# Patient Record
Sex: Male | Born: 1980 | Hispanic: Yes | Marital: Single | Smoking: Never smoker
Health system: Southern US, Community
[De-identification: ages and names within clinical notes are randomized; demographics above are authoritative.]

---

## 2021-04-17 ENCOUNTER — Ambulatory Visit
Admission: EM | Admit: 2021-04-17 | Discharge: 2021-04-17 | Disposition: A | Discharge status: Home / Self Care | Payer: 59 | Attending: Physician Assistant | Admitting: Physician Assistant

## 2021-04-17 ENCOUNTER — Other Ambulatory Visit: Payer: Self-pay

## 2021-04-17 DIAGNOSIS — Z20822 Contact with and (suspected) exposure to covid-19: Secondary | ICD-10-CM

## 2021-04-17 NOTE — ED Triage Notes (Signed)
Pt c/o chest pressure, "bubble guts," "sombody has their hand poking me in the back," headache, body aches and chills, nausea, diarrhea.   Denies cough, vomiting, constipation.   Onset Tuesday. Covid exposure in house with wife and son.

## 2021-04-17 NOTE — ED Provider Notes (Signed)
[facility]    CSN: [id number] Arrival date & time: 04/17/21  1217      History   Chief Complaint Chief Complaint  Patient presents with   Cough    HPI Roy Thompson is a 40 y.o. male.   Patient here today for evaluation of nasal congestion, chest congestion, cough, body aches, chills that started about 2 days ago.  He reports that he has had exposure to COVID through his wife and son.  He has tried over-the-counter medication without resolution.  He reports he did have diarrhea this morning as well.  The history is provided by the patient.  Cough Associated symptoms: chills   Associated symptoms: no ear pain, no eye discharge, no fever, no shortness of breath and no sore throat    History reviewed. No pertinent past medical history.  There are no problems to display for this patient.   History reviewed. No pertinent surgical history.     Home Medications    Prior to Admission medications   Not on File    Family History History reviewed. No pertinent family history.  Social History Social History   Tobacco Use   Smoking status: Never   Smokeless tobacco: Never     Allergies   Patient has no allergy information on record.   Review of Systems Review of Systems  Constitutional:  Positive for chills. Negative for fever.  HENT:  Positive for congestion. Negative for ear pain and sore throat.   Eyes:  Negative for discharge and redness.  Respiratory:  Positive for cough. Negative for shortness of breath.   Gastrointestinal:  Positive for diarrhea. Negative for abdominal pain, nausea and vomiting.    Physical Exam Triage Vital Signs ED Triage Vitals  Enc Vitals Group     BP 04/17/21 1420 (!) 165/113     Pulse Rate 04/17/21 1420 72     Resp 04/17/21 1420 18     Temp 04/17/21 1420 98.1 F (36.7 C)     Temp Source 04/17/21 1420 Oral     SpO2 04/17/21 1420 97 %     Weight --      Height --      Head Circumference --      Peak  Flow --      Pain Score 04/17/21 1422 0     Pain Loc --      Pain Edu? --      Excl. in GC? --    No data found.  Updated Vital Signs BP (!) 165/113 (BP Location: Left Arm)    Pulse 72    Temp 98.1 F (36.7 C) (Oral)    Resp 18    SpO2 97%      Physical Exam Vitals and nursing note reviewed.  Constitutional:      General: He is not in acute distress.    Appearance: Normal appearance. He is not ill-appearing.  HENT:     Head: Normocephalic and atraumatic.     Nose: Congestion present.  Eyes:     Conjunctiva/sclera: Conjunctivae normal.  Cardiovascular:     Rate and Rhythm: Normal rate and regular rhythm.     Heart sounds: Normal heart sounds. No murmur heard. Pulmonary:     Effort: Pulmonary effort is normal. No respiratory distress.     Breath sounds: Normal breath sounds. No wheezing, rhonchi or rales.  Skin:    General: Skin is warm and dry.  Neurological:     Mental Status: He is  alert.  Psychiatric:        Mood and Affect: Mood normal.        Thought Content: Thought content normal.     UC Treatments / Results  Labs (all labs ordered are listed, but only abnormal results are displayed) Labs Reviewed  COVID-19, FLU A+B NAA    EKG   Radiology No results found.  Procedures Procedures (including critical care time)  Medications Ordered in UC Medications - No data to display  Initial Impression / Assessment and Plan / UC Course  I have reviewed the triage vital signs and the nursing notes.  Pertinent labs & imaging results that were available during my care of the patient were reviewed by me and considered in my medical decision making (see chart for details).    Suspect symptoms are most likely related to COVID but will screen for same. Recommend symptomatic treatment and follow up with any further concerns.   Final Clinical Impressions(s) / UC Diagnoses   Final diagnoses:  Close exposure to COVID-19 virus   Discharge Instructions   None     ED Prescriptions   None    PDMP not reviewed this encounter.   Tomi Bamberger, PA-C 04/17/21 1453

## 2021-04-18 LAB — COVID-19, FLU A+B NAA
Influenza A, NAA: NOT DETECTED
Influenza B, NAA: NOT DETECTED
SARS-CoV-2, NAA: DETECTED — AB

## 2021-10-01 ENCOUNTER — Ambulatory Visit
Admission: EM | Admit: 2021-10-01 | Discharge: 2021-10-01 | Disposition: A | Discharge status: Home / Self Care | Payer: 59 | Attending: Urgent Care | Admitting: Urgent Care

## 2021-10-01 ENCOUNTER — Ambulatory Visit (INDEPENDENT_AMBULATORY_CARE_PROVIDER_SITE_OTHER): Payer: 59

## 2021-10-01 DIAGNOSIS — S91311A Laceration without foreign body, right foot, initial encounter: Secondary | ICD-10-CM | POA: Diagnosis not present

## 2021-10-01 DIAGNOSIS — Z23 Encounter for immunization: Secondary | ICD-10-CM | POA: Diagnosis not present

## 2021-10-01 DIAGNOSIS — S91011A Laceration without foreign body, right ankle, initial encounter: Secondary | ICD-10-CM | POA: Diagnosis not present

## 2021-10-01 MED ORDER — TETANUS-DIPHTH-ACELL PERTUSSIS 5-2.5-18.5 LF-MCG/0.5 IM SUSY
0.5000 mL | PREFILLED_SYRINGE | Freq: Once | INTRAMUSCULAR | Status: AC
  Administered 2021-10-01: 0.5 mL via INTRAMUSCULAR

## 2021-10-01 NOTE — Discharge Instructions (Addendum)
You have no retained glass in your cut. Keep your stitches dry for the first 24-48 hours. Apply topical polysporin or bacitracin ointment only. Cover with a NONSTICK pad. Do not use a woven gauze. After the first 24 hours, keep the wound open at night or while at home, keep covered when at work or out of the house. Return for recheck in 7 days. Will removed the sutures in 7-10 days depending on its appearance. Monitor for signs of infection including redness, warmth, or drainage. You received a tetanus vaccination today.

## 2021-10-01 NOTE — ED Triage Notes (Signed)
Pt c/o laceration to right posterior ankle 2/2 dropping glass on foot. Laceration actively bleeding. Pressure applied.

## 2021-10-05 NOTE — ED Provider Notes (Signed)
[facility]    CSN: [id number] Arrival date & time: 10/01/21  1620      History   Chief Complaint Chief Complaint  Patient presents with   Laceration    HPI Roy Thompson is a 41 y.o. male.   Pleasant 40yo male presents after sustaining a laceration to his R posterior heel. He works as a Therapist, occupationalwindow installer, and states a glass pane fell cutting his posterior heel roughly 3 hours prior to arrival. He did not clean his wound prior. He is uncertain his last Tdap. He reports full sensation of his ankle/ toes and FROM.   Laceration  History reviewed. No pertinent past medical history.  There are no problems to display for this patient.   History reviewed. No pertinent surgical history.     Home Medications    Prior to Admission medications   Not on File    Family History History reviewed. No pertinent family history.  Social History Social History   Tobacco Use   Smoking status: Never   Smokeless tobacco: Never     Allergies   Patient has no allergy information on record.   Review of Systems Review of Systems  Skin:  Positive for wound (laceration right heel).    Physical Exam Triage Vital Signs ED Triage Vitals [10/01/21 1656]  Enc Vitals Group     BP (!) 149/83     Pulse Rate 72     Resp 18     Temp 98.3 F (36.8 C)     Temp Source Oral     SpO2 96 %     Weight      Height      Head Circumference      Peak Flow      Pain Score 0     Pain Loc      Pain Edu?      Excl. in GC?    No data found.  Updated Vital Signs BP (!) 149/83 (BP Location: Left Arm)   Pulse 72   Temp 98.3 F (36.8 C) (Oral)   Resp 18   SpO2 96%   Visual Acuity Right Eye Distance:   Left Eye Distance:   Bilateral Distance:    Right Eye Near:   Left Eye Near:    Bilateral Near:     Physical Exam Vitals and nursing note reviewed. Exam conducted with a chaperone present.  Constitutional:      Appearance: Normal appearance. He is normal  weight. He is not ill-appearing, toxic-appearing or diaphoretic.  HENT:     Head: Normocephalic and atraumatic.  Cardiovascular:     Rate and Rhythm: Normal rate.     Pulses: Normal pulses.  Pulmonary:     Effort: Pulmonary effort is normal. No respiratory distress.  Musculoskeletal:        General: Signs of injury (1.5" superficial laceration to R posterior heel overtop of achiles tendon, but not revealing any subcutaneous tissue. No tendon involvement. Neurovascularly intact. FROM. No FB after meticulous inspection.) present. No swelling, tenderness or deformity. Normal range of motion.     Right lower leg: No edema.     Left lower leg: No edema.  Skin:    General: Skin is warm and dry.     Capillary Refill: Capillary refill takes less than 2 seconds.     Coloration: Skin is not jaundiced.     Findings: No bruising.  Neurological:     General: No focal deficit present.  Mental Status: He is alert and oriented to person, place, and time.     Sensory: No sensory deficit.     Motor: No weakness.  Psychiatric:        Mood and Affect: Mood normal.     UC Treatments / Results  Labs (all labs ordered are listed, but only abnormal results are displayed) Labs Reviewed - No data to display  EKG   Radiology CLINICAL DATA:  Laceration posterior ankle   EXAM: RIGHT ANKLE - COMPLETE 3+ VIEW   COMPARISON:  None Available.   FINDINGS: Normal alignment no fracture.  Ankle joint space normal.   Posterior laceration.  No foreign body.   IMPRESSION: Negative for fracture.  Posterior laceration.     Electronically Signed   By: Marlan Palau M.D.   On: 10/01/2021 17:37  Procedures Laceration Repair  Date/Time: 10/01/2021 5:37 PM Performed by: Maretta Bees, PA Authorized by: Maretta Bees, PA   Consent:    Consent obtained:  Verbal   Consent given by:  Patient   Risks, benefits, and alternatives were discussed: yes     Risks discussed:  Infection, pain,  retained foreign body, tendon damage, poor wound healing, poor cosmetic result, vascular damage, nerve damage and need for additional repair   Alternatives discussed:  No treatment Universal protocol:    Procedure explained and questions answered to patient or proxy's satisfaction: yes     Imaging studies available: yes     Patient identity confirmed:  Verbally with patient Anesthesia:    Anesthesia method:  Topical application and local infiltration   Topical anesthetic:  LET   Local anesthetic:  Lidocaine 1% w/o epi Laceration details:    Location:  Foot   Foot location:  R achilles   Length (cm):  3   Depth (mm):  2 Pre-procedure details:    Preparation:  Patient was prepped and draped in usual sterile fashion and imaging obtained to evaluate for foreign bodies Exploration:    Limited defect created (wound extended): yes     Hemostasis achieved with:  Direct pressure and LET   Imaging obtained: x-ray     Imaging outcome: foreign body not noted     Wound exploration: wound explored through full range of motion and entire depth of wound visualized     Contaminated: no   Treatment:    Area cleansed with:  Povidone-iodine   Amount of cleaning:  Standard   Irrigation solution:  Sterile saline   Irrigation method:  Syringe Skin repair:    Repair method:  Sutures   Suture size:  3-0   Suture material:  Prolene   Suture technique:  Simple interrupted   Number of sutures:  5 Approximation:    Approximation:  Close Repair type:    Repair type:  Simple Post-procedure details:    Dressing:  Antibiotic ointment and non-adherent dressing   Procedure completion:  Tolerated (including critical care time)  Medications Ordered in UC Medications  Tdap (BOOSTRIX) injection 0.5 mL (0.5 mLs Intramuscular Given 10/01/21 1702)    Initial Impression / Assessment and Plan / UC Course  I have reviewed the triage vital signs and the nursing notes.  Pertinent labs & imaging results that were  available during my care of the patient were reviewed by me and considered in my medical decision making (see chart for details).     Simple laceration to Posterior R ankle - xray performed given patients discomfort level. No radiopaque FB, no FB  visually after meticulous inspection. This was a relatively superficial lac with no subcutaneous tissue. Five 3-0 sutures placed in sterile fashion after ample wound cleansing and application of LET gel. Pt tolerated procedure well. Tdap updated. Wound care instructions given, will RTC in 7-10 days for suture removal, sooner if any signs of infection. Apply topical abx ointment.  Tdap - updated in office.  Final Clinical Impressions(s) / UC Diagnoses   Final diagnoses:  Laceration of right heel, initial encounter  Need for prophylactic vaccination with combined diphtheria-tetanus-pertussis (DTP) vaccine     Discharge Instructions      You have no retained glass in your cut. Keep your stitches dry for the first 24-48 hours. Apply topical polysporin or bacitracin ointment only. Cover with a NONSTICK pad. Do not use a woven gauze. After the first 24 hours, keep the wound open at night or while at home, keep covered when at work or out of the house. Return for recheck in 7 days. Will removed the sutures in 7-10 days depending on its appearance. Monitor for signs of infection including redness, warmth, or drainage. You received a tetanus vaccination today.   ED Prescriptions   None    PDMP not reviewed this encounter.   Maretta Bees, [place] 10/05/21 2021

## 2021-10-08 ENCOUNTER — Ambulatory Visit
Admission: EM | Admit: 2021-10-08 | Discharge: 2021-10-08 | Disposition: A | Discharge status: Home / Self Care | Payer: 59 | Attending: Internal Medicine | Admitting: Internal Medicine

## 2021-10-08 DIAGNOSIS — Z5189 Encounter for other specified aftercare: Secondary | ICD-10-CM

## 2021-10-08 NOTE — Discharge Instructions (Signed)
Keep wound covered until healed.  I would recommend that you follow-up in 3 days to assess for suture removal again.  Follow-up with orthopedist due to sensation of leg tightness.

## 2021-10-08 NOTE — ED Triage Notes (Signed)
Pt here to remove sutures. Provider to check if appropriate. Also c/o left leg tightness.

## 2021-10-08 NOTE — ED Provider Notes (Signed)
[facility]    CSN: [id number] Arrival date & time: 10/08/21  1723      History   Chief Complaint Chief Complaint  Patient presents with   Wound Check    HPI BENYAMIN JEFF is a 41 y.o. male.   Patient presents today for suture removal.  Sutures are present to right posterior lower leg.  Called to room by nursing during suture removal as nursing staff reported that they are not sure if sutures need to be removed at this time.  They removed 2 sutures, and nursing is concerned that the wound does not close completely.  Patient denies any purulent drainage, increased redness, swelling from wound.  He does report that he has been having some "leg tightness" throughout the leg above the suture site that started 3 days after the laceration.  Patient reports that he feels a tightness mainly with range of motion and reports that it is causing limited range of motion.  Denies any numbness or tingling.  Patient is able to bear weight. Laceration occurred one week ago on 5/31.    Wound Check   History reviewed. No pertinent past medical history.  There are no problems to display for this patient.   History reviewed. No pertinent surgical history.     Home Medications    Prior to Admission medications   Not on File    Family History History reviewed. No pertinent family history.  Social History Social History   Tobacco Use   Smoking status: Never   Smokeless tobacco: Never     Allergies   Patient has no allergy information on record.   Review of Systems Review of Systems Per HPI  Physical Exam Triage Vital Signs ED Triage Vitals  Enc Vitals Group     BP 10/08/21 1738 120/80     Pulse Rate 10/08/21 1738 85     Resp 10/08/21 1738 18     Temp 10/08/21 1738 98 F (36.7 C)     Temp Source 10/08/21 1738 Oral     SpO2 10/08/21 1738 98 %     Weight --      Height --      Head Circumference --      Peak Flow --      Pain Score 10/08/21 1739 0      Pain Loc --      Pain Edu? --      Excl. in GC? --    No data found.  Updated Vital Signs BP 120/80 (BP Location: Left Arm)   Pulse 85   Temp 98 F (36.7 C) (Oral)   Resp 18   SpO2 98%   Visual Acuity Right Eye Distance:   Left Eye Distance:   Bilateral Distance:    Right Eye Near:   Left Eye Near:    Bilateral Near:     Physical Exam Constitutional:      General: He is not in acute distress.    Appearance: Normal appearance. He is not toxic-appearing or diaphoretic.  HENT:     Head: Normocephalic and atraumatic.  Eyes:     Extraocular Movements: Extraocular movements intact.     Conjunctiva/sclera: Conjunctivae normal.  Pulmonary:     Effort: Pulmonary effort is normal.  Musculoskeletal:     Comments: Patient has full range of motion of lower extremity but reports a "tightness" with range of motion.  Patient can wiggle toes.  Capillary refill and pulses normal.  Neurovascular intact.  No  tenderness to palpation other than the suture site.  Skin:    Comments: Healing laceration to right lower posterior leg directly above the Achilles.  2 sutures were removed by nursing staff but wound edges are not closely approximated.  Other 3 sutures are still in place.  No purulent drainage, increased redness, swelling noted.   Neurological:     General: No focal deficit present.     Mental Status: He is alert and oriented to person, place, and time. Mental status is at baseline.  Psychiatric:        Mood and Affect: Mood normal.        Behavior: Behavior normal.        Thought Content: Thought content normal.        Judgment: Judgment normal.     UC Treatments / Results  Labs (all labs ordered are listed, but only abnormal results are displayed) Labs Reviewed - No data to display  EKG   Radiology No results found.  Procedures Procedures (including critical care time)  Medications Ordered in UC Medications - No data to display  Initial Impression / Assessment  and Plan / UC Course  I have reviewed the triage vital signs and the nursing notes.  Pertinent labs & imaging results that were available during my care of the patient were reviewed by me and considered in my medical decision making (see chart for details).     Will keep 3 remaining sutures in place as wound is not ready for suture removal.  Patient was advised to return in 3 days for reevaluation which will be 10-day mark from laceration repair.  No concern for bacterial infection.  Do not think that any additional sutures or wound closure is necessary at this time as left portion of wound where sutures have been removed are not closely approximated but are not gaping open.  Discussed with patient that it may cause scarring. It is still healing adequately. Nonadherent dressing was applied in urgent care to prevent infection and patient was advised to keep covered until healed over.  Patient to return if signs of infection occur sooner.    Originally advised patient that it would be best to go to the hospital for further imaging of the leg to ensure no tendon or muscular injury has been present from laceration given leg tightness and limited range of motion since laceration occurred. No concern for DVT. Patient declined going to the hospital.  Risks associated with not going to hospital were discussed with patient.  Patient voiced understanding.  Patient was agreeable to following up with orthopedist at provided contact information tomorrow to further assess this symptom.  Patient verbalized understanding and was agreeable with plan. Final Clinical Impressions(s) / UC Diagnoses   Final diagnoses:  Visit for wound check     Discharge Instructions      Keep wound covered until healed.  I would recommend that you follow-up in 3 days to assess for suture removal again.  Follow-up with orthopedist due to sensation of leg tightness.    ED Prescriptions   None    PDMP not reviewed this  encounter.   Gustavus Bryant, [place] 10/08/21 1859

## 2021-10-14 ENCOUNTER — Ambulatory Visit
Admission: EM | Admit: 2021-10-14 | Discharge: 2021-10-14 | Disposition: A | Discharge status: Home / Self Care | Payer: Commercial Managed Care - HMO | Attending: Internal Medicine | Admitting: Internal Medicine

## 2021-10-14 NOTE — ED Triage Notes (Signed)
Pt presents for suture removal. Previously had 2 sutures removed and 3 were left for complete healing and now presents healed and closed. Pt denies pain, itching and swelling.

## 2022-01-20 ENCOUNTER — Ambulatory Visit (INDEPENDENT_AMBULATORY_CARE_PROVIDER_SITE_OTHER): Payer: Commercial Managed Care - HMO | Admitting: Primary Care

## 2022-06-21 ENCOUNTER — Other Ambulatory Visit: Payer: Self-pay

## 2022-06-21 ENCOUNTER — Emergency Department (HOSPITAL_COMMUNITY)
Admission: EM | Admit: 2022-06-21 | Discharge: 2022-06-21 | Disposition: A | Discharge status: Home / Self Care | Payer: Commercial Managed Care - HMO | Attending: Emergency Medicine | Admitting: Emergency Medicine

## 2022-06-21 DIAGNOSIS — H9201 Otalgia, right ear: Secondary | ICD-10-CM | POA: Insufficient documentation

## 2022-06-21 DIAGNOSIS — J019 Acute sinusitis, unspecified: Secondary | ICD-10-CM | POA: Insufficient documentation

## 2022-06-21 DIAGNOSIS — Z1152 Encounter for screening for COVID-19: Secondary | ICD-10-CM | POA: Insufficient documentation

## 2022-06-21 DIAGNOSIS — R0981 Nasal congestion: Secondary | ICD-10-CM | POA: Diagnosis present

## 2022-06-21 LAB — RESP PANEL BY RT-PCR (RSV, FLU A&B, COVID)  RVPGX2
Influenza A by PCR: NEGATIVE
Influenza B by PCR: NEGATIVE
Resp Syncytial Virus by PCR: NEGATIVE
SARS Coronavirus 2 by RT PCR: NEGATIVE

## 2022-06-21 MED ORDER — AMOXICILLIN 500 MG PO CAPS: 500.0000 mg | ORAL_CAPSULE | Freq: Three times a day (TID) | ORAL | 0 refills | Status: DC

## 2022-06-21 MED ORDER — AMOXICILLIN 500 MG PO CAPS
500.0000 mg | ORAL_CAPSULE | Freq: Three times a day (TID) | ORAL | 0 refills | Status: AC
Start: 2022-06-21 — End: ?

## 2022-06-21 NOTE — ED Triage Notes (Signed)
Patient arrived stating that he has had nasal congestion and pressure in his right ear over the last 3 days.

## 2022-06-21 NOTE — Discharge Instructions (Signed)
Please take your antibiotics as prescribed. Take tylenol/ibuprofen for pain. I recommend close follow-up with PCP for reevaluation.  Please do not hesitate to return to emergency department if worrisome signs symptoms we discussed become apparent.

## 2022-06-21 NOTE — ED Provider Notes (Signed)
[facility] Provider Note   CSN: [id number] Arrival date & time: 06/21/22  1905     History  Chief Complaint  Patient presents with   Nasal Congestion   Otalgia    Roy Thompson is a 42 y.o. male with no significant past medical history presenting today for evaluation of nasal congestion and ear pain.  Patient reports he started to have nasal congestion and pain of his sinuses 3 days ago.  Today he developed right ear pain.  Denies any hearing loss.  Denies fever, nausea, vomiting.  Patient had flulike symptoms last week before his nasal congestion and ear pain started.  Patient has tried over-the-counter allergy medications with no relief.   Otalgia   No past medical history on file. No past surgical history on file.   Home Medications Prior to Admission medications   Medication Sig Start Date End Date Taking? Authorizing Provider  amoxicillin (AMOXIL) 500 MG capsule Take 1 capsule (500 mg total) by mouth 3 (three) times daily. 06/21/22   Rex Kras, PA      Allergies    Patient has no known allergies.    Review of Systems   Review of Systems  HENT:  Positive for ear pain.     Physical Exam Updated Vital Signs BP (!) 158/107   Pulse 94   Temp 98.4 F (36.9 C) (Oral)   Resp 18   SpO2 95%  Physical Exam Vitals and nursing note reviewed.  Constitutional:      Appearance: Normal appearance.  HENT:     Head: Normocephalic and atraumatic.     Ears:     Comments: Bulging of R tympanic membrane.    Mouth/Throat:     Mouth: Mucous membranes are moist.  Eyes:     General: No scleral icterus. Cardiovascular:     Rate and Rhythm: Normal rate and regular rhythm.     Pulses: Normal pulses.     Heart sounds: Normal heart sounds.  Pulmonary:     Effort: Pulmonary effort is normal.     Breath sounds: Normal breath sounds.  Abdominal:     General: Abdomen is flat.     Palpations: Abdomen is soft.     Tenderness:  There is no abdominal tenderness.  Musculoskeletal:        General: No deformity.  Skin:    General: Skin is warm.     Findings: No rash.  Neurological:     General: No focal deficit present.     Mental Status: He is alert.  Psychiatric:        Mood and Affect: Mood normal.     ED Results / Procedures / Treatments   Labs (all labs ordered are listed, but only abnormal results are displayed) Labs Reviewed  RESP PANEL BY RT-PCR (RSV, FLU A&B, COVID)  [id number]    EKG None  Radiology No results found.  Procedures Procedures    Medications Ordered in ED Medications - No data to display  ED Course/ Medical Decision Making/ A&P                             Medical Decision Making Risk Prescription drug management.   This patient presents to the ED for ear pain and sinus pain, this involves an extensive number of treatment options, and is a complaint that carries with a high risk of complications and morbidity.  The differential  diagnosis includes sinusitis, otitis media, otitis externa, flu, COVID, RSV, strep.  This is not an exhaustive list.  Lab tests: I ordered and personally interpreted labs.  The pertinent results include: Viral panel negative.  Problem list/ ED course/ Critical interventions/ Medical management: HPI: See above Vital signs within normal range and stable throughout visit. Laboratory/imaging studies significant for: See above. On physical examination, patient is afebrile and appears in no acute distress.  Based on patient's clinical presentations and laboratory/imaging studies I suspect viral vs bacterial sinusitis vs otitis media. Exam and history most consistent with AOM. I have a low suspicion at this time for mastoiditis, malignant otitis externa, herpes or ramsey hunt syndrome, or retained foreign body. Will give wait and see prescription for amoxicillin. If symptoms worsen or persist for 48-72 then pt to fill the prescription. Cautious return  precautions discussed w/ full understanding.  I have reviewed the patient home medicines and have made adjustments as needed.  Cardiac monitoring/EKG: The patient was maintained on a cardiac monitor.  I personally reviewed and interpreted the cardiac monitor which showed an underlying rhythm of: sinus rhythm.  Additional history obtained: External records from outside source obtained and reviewed including: Chart review including previous notes, labs, imaging.  Disposition Continued outpatient therapy. Follow-up with PCP recommended for reevaluation of symptoms. Treatment plan discussed with patient.  Pt acknowledged understanding was agreeable to the plan. Worrisome signs and symptoms were discussed with patient, and patient acknowledged understanding to return to the ED if they noticed these signs and symptoms. Patient was stable upon discharge.   This chart was dictated using voice recognition software.  Despite best efforts to proofread,  errors can occur which can change the documentation meaning.          Final Clinical Impression(s) / ED Diagnoses Final diagnoses:  Right ear pain  Acute non-recurrent sinusitis, unspecified location    Rx / DC Orders ED Discharge Orders          Ordered    amoxicillin (AMOXIL) 500 MG capsule  3 times daily,   Status:  Discontinued        06/21/22 2059    amoxicillin (AMOXIL) 500 MG capsule  3 times daily        06/21/22 2104              Rex Kras, PA 06/21/22 2253    Tretha Sciara, MD 06/22/22 [phone]

## 2023-01-09 IMAGING — DX DG ANKLE COMPLETE 3+V*R*
3 series · 3 of 3 positions shown · non-contrast
Comparison: None Available.

CLINICAL DATA: Laceration posterior ankle

EXAM:
RIGHT ANKLE - COMPLETE 3+ VIEW

[ankle ap]
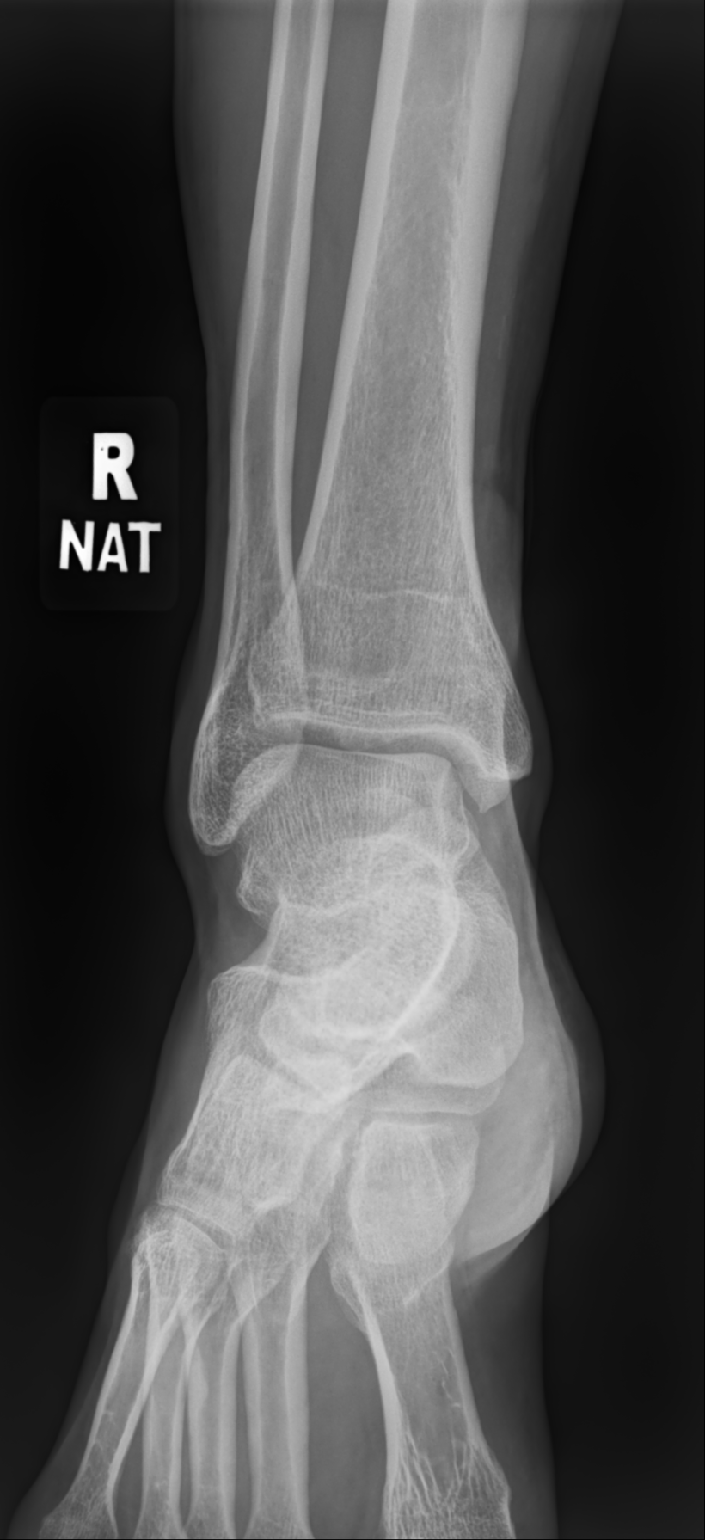

[ankle medial oblique]
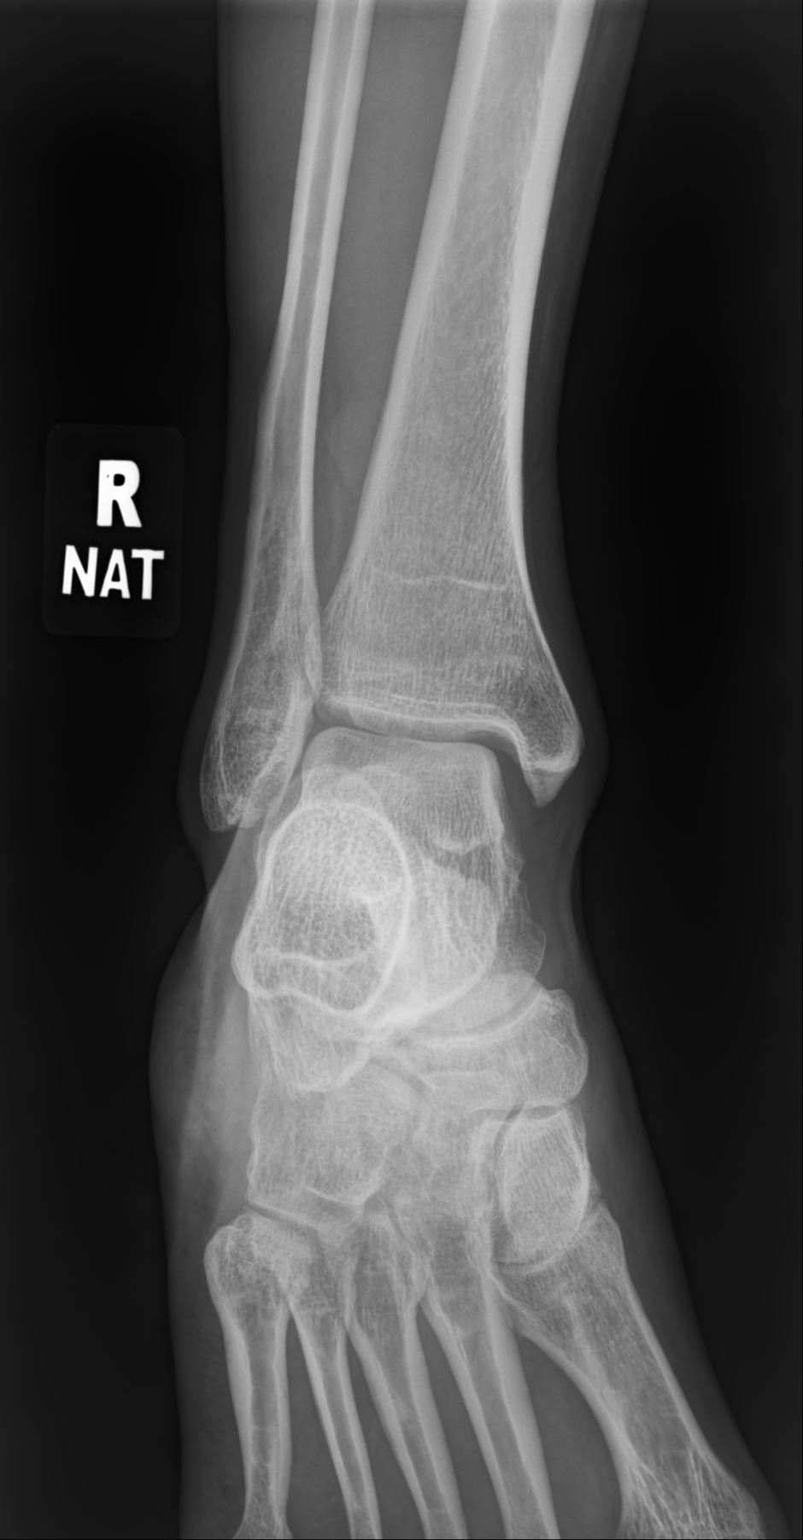

[ankle lat]
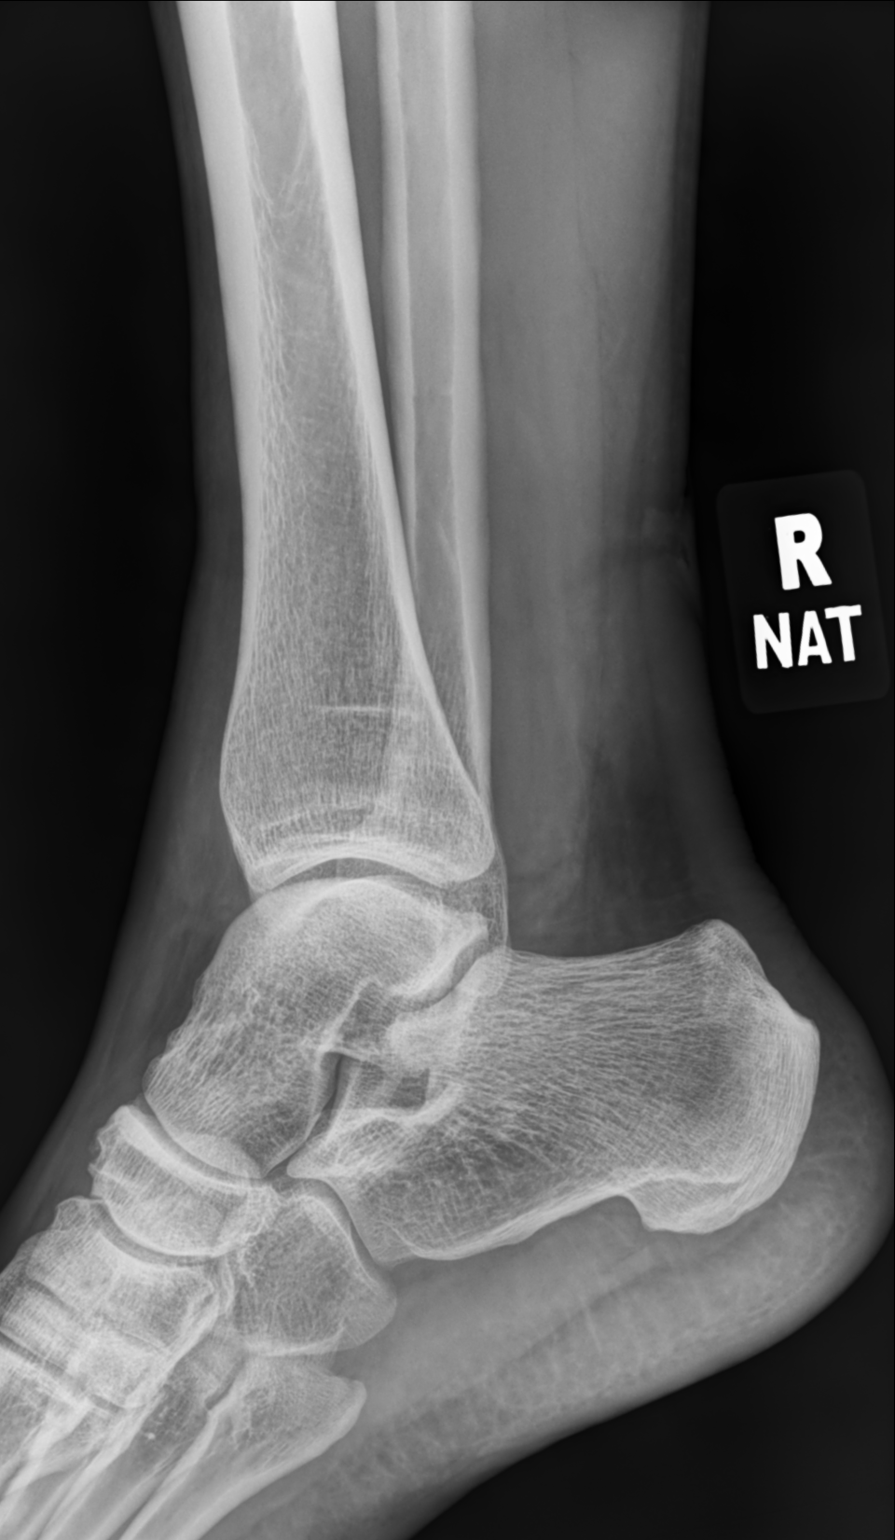

[3 of 3 positions shown; findings below may reference images not displayed]

FINDINGS: Normal alignment no fracture.  Ankle joint space normal.

Posterior laceration.  No foreign body.
IMPRESSION: Negative for fracture.  Posterior laceration.
